# Patient Record
Sex: Male | Born: 1990 | Race: Black or African American | Hispanic: No | Marital: Single | Smoking: Former smoker
Health system: Southern US, Community
[De-identification: ages and names within clinical notes are randomized; demographics above are authoritative.]

---
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
## 2013-06-30 ENCOUNTER — Encounter (HOSPITAL_COMMUNITY): Payer: Self-pay | Admitting: Emergency Medicine

## 2013-06-30 ENCOUNTER — Emergency Department (HOSPITAL_COMMUNITY)
Admission: EM | Admit: 2013-06-30 | Discharge: 2013-06-30 | Disposition: A | Discharge status: Home / Self Care | Payer: Self-pay | Attending: Emergency Medicine | Admitting: Emergency Medicine

## 2013-06-30 DIAGNOSIS — F17200 Nicotine dependence, unspecified, uncomplicated: Secondary | ICD-10-CM | POA: Insufficient documentation

## 2013-06-30 DIAGNOSIS — N342 Other urethritis: Secondary | ICD-10-CM | POA: Insufficient documentation

## 2013-06-30 LAB — URINALYSIS, ROUTINE W REFLEX MICROSCOPIC
Bilirubin Urine: NEGATIVE
Glucose, UA: NEGATIVE mg/dL
Ketones, ur: NEGATIVE mg/dL
Nitrite: NEGATIVE
Protein, ur: NEGATIVE mg/dL
Specific Gravity, Urine: <1.005 — ABNORMAL LOW (ref 1.005–1.030)
Urobilinogen, UA: 0.2 mg/dL (ref 0.0–1.0)
pH: 6 (ref 5.0–8.0)

## 2013-06-30 LAB — URINE MICROSCOPIC-ADD ON

## 2013-06-30 MED ORDER — LIDOCAINE HCL (PF) 1 % IJ SOLN
INTRAMUSCULAR | Status: AC
  Administered 2013-06-30: 2.1 mL
  Filled 2013-06-30: qty 5

## 2013-06-30 MED ORDER — AZITHROMYCIN 250 MG PO TABS
1000.0000 mg | ORAL_TABLET | Freq: Once | ORAL | Status: AC
  Administered 2013-06-30: 1000 mg via ORAL
  Filled 2013-06-30: qty 4

## 2013-06-30 MED ORDER — CEFTRIAXONE SODIUM 250 MG IJ SOLR
250.0000 mg | Freq: Once | INTRAMUSCULAR | Status: AC
  Administered 2013-06-30: 250 mg via INTRAMUSCULAR
  Filled 2013-06-30: qty 250

## 2013-06-30 NOTE — ED Notes (Signed)
Pt c/o clear/yellow penile discharge and burning with urination.

## 2013-07-01 LAB — URINE CULTURE: Colony Count: NO GROWTH

## 2013-07-01 NOTE — ED Provider Notes (Signed)
CSN: [id number]     Arrival date & time 06/30/13  1017 History   First MD Initiated Contact with Patient 06/30/13 1102     Chief Complaint  Patient presents with  . SEXUALLY TRANSMITTED DISEASE   (Consider location/radiation/quality/duration/timing/severity/associated sxs/prior Treatment) Patient is a 22 y.o. male presenting with penile discharge. The history is provided by the patient.  Penile Discharge This is a new problem. The current episode started in the past 7 days. The problem occurs constantly. The problem has been unchanged. Associated symptoms include urinary symptoms. Pertinent negatives include no abdominal pain, chills, fever, headaches, joint swelling, myalgias, nausea, numbness, rash, sore throat, swollen glands, vomiting or weakness. Exacerbated by: urination. He has tried nothing for the symptoms. The treatment provided no relief.    History reviewed. No pertinent past medical history. History reviewed. No pertinent past surgical history. No family history on file. History  Substance Use Topics  . Smoking status: Current Every Day Smoker -- 1.00 packs/day  . Smokeless tobacco: Not on file  . Alcohol Use: Yes     Comment: occasional    Review of Systems  Constitutional: Negative for fever, chills, activity change and appetite change.  HENT: Negative for sore throat.   Gastrointestinal: Negative for nausea, vomiting and abdominal pain.  Genitourinary: Positive for dysuria and discharge. Negative for frequency, hematuria, flank pain, decreased urine volume, scrotal swelling, difficulty urinating, genital sores, penile pain and testicular pain.  Musculoskeletal: Negative for back pain, joint swelling and myalgias.  Skin: Negative for rash.  Neurological: Negative for weakness, numbness and headaches.  All other systems reviewed and are negative.    Allergies  Review of patient's allergies indicates no known allergies.  Home Medications  No current outpatient  prescriptions on file. BP 144/84  Pulse 85  Temp(Src) 97 F (36.1 C)  Resp 18  Ht 6\' 1"  (1.854 m)  Wt 186 lb (84.369 kg)  BMI 24.55 kg/m2  SpO2 100% Physical Exam  Nursing note and vitals reviewed. Cardiovascular: Normal rate, regular rhythm, normal heart sounds and intact distal pulses.   No murmur heard. Pulmonary/Chest: Effort normal and breath sounds normal. No respiratory distress.  Abdominal: Soft. He exhibits no distension. There is no tenderness. There is no rebound and no guarding.  Genitourinary: Testes normal and penis normal. Right testis shows no mass and no tenderness. Left testis shows no mass and no tenderness. Circumcised.  Yellow drainage from penis.  No lesions, erythema or edema.    Musculoskeletal: Normal range of motion.    ED Course  Procedures (including critical care time) Labs Review Labs Reviewed  URINALYSIS, ROUTINE W REFLEX MICROSCOPIC - Abnormal; Notable for the following:    Specific Gravity, Urine <1.005 (*)    Hgb urine dipstick TRACE (*)    Leukocytes, UA MODERATE (*)    All other components within normal limits  URINE MICROSCOPIC-ADD ON - Abnormal; Notable for the following:    Bacteria, UA MANY (*)    All other components within normal limits  URINE CULTURE  GC/CHLAMYDIA PROBE AMP   Imaging Review No results found.  EKG Interpretation   None       MDM   1. Urethritis, unspecified    Pt reports recent unprotected intercourse.  Has yellow penile drainage w/o abd pain, fever, vomiting or testicle pain.  He agrees to IM rocephin and po Zithromax.  Gc and Chlamydia cultures pending.     He is well appearing and stable for d/c.     Reginald Carter  Merlene Morse, PA-C 07/01/13 2119

## 2013-07-02 LAB — GC/CHLAMYDIA PROBE AMP
CT Probe RNA: POSITIVE — AB
GC Probe RNA: POSITIVE — AB

## 2013-07-02 NOTE — ED Provider Notes (Signed)
Medical screening examination/treatment/procedure(s) were performed by non-physician practitioner and as supervising physician I was immediately available for consultation/collaboration.  EKG Interpretation   None        Elira Colasanti, MD 07/02/13 1318 

## 2013-07-03 ENCOUNTER — Telehealth (HOSPITAL_COMMUNITY): Payer: Self-pay | Admitting: Emergency Medicine

## 2013-07-03 NOTE — ED Notes (Signed)
Patient informed of positive results after id'd x 2 and informed of need to notify partner to be treated. 

## 2013-07-03 NOTE — ED Notes (Signed)
Patient has +Chalmydia and +Gonorrhea.

## 2013-07-03 NOTE — ED Notes (Signed)
+  Chlamydia. +Gonorrhea. Patient treated with Rocephin and Zithromax. [employer] faxed. 

## 2024-01-13 ENCOUNTER — Encounter: Payer: Self-pay | Admitting: Nurse Practitioner

## 2024-01-13 ENCOUNTER — Ambulatory Visit: Payer: Self-pay | Admitting: Nurse Practitioner

## 2024-01-13 DIAGNOSIS — N341 Nonspecific urethritis: Secondary | ICD-10-CM

## 2024-01-13 DIAGNOSIS — Z113 Encounter for screening for infections with a predominantly sexual mode of transmission: Secondary | ICD-10-CM

## 2024-01-13 LAB — GRAM STAIN

## 2024-01-13 LAB — HM HEPATITIS C SCREENING LAB: HM Hepatitis Screen: NEGATIVE

## 2024-01-13 LAB — HM HIV SCREENING LAB: HM HIV Screening: NEGATIVE

## 2024-01-13 MED ORDER — DOXYCYCLINE HYCLATE 100 MG PO TABS: 100.0000 mg | ORAL_TABLET | Freq: Two times a day (BID) | ORAL | Status: AC
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
## 2024-01-13 NOTE — Progress Notes (Signed)
 Pt here for STI screening.  Gram stain results reviewed with patient.  Results positive for NGU.  Doxycycline 100mg  #14 dispensed to patient.  Counseled on medication, side effects, plan of care and when to contact clinic with questions or concerns.  Verbalizes understanding.  Contact card x 1.  [employer] pamphlet provided.  Report card completed.-Marcelino Campos, RN

## 2024-01-15 LAB — HBV ANTIGEN/ANTIBODY STATE LAB
Hep B Core Total Ab: NONREACTIVE
Hep B S Ab: REACTIVE
Hepatitis B Surface Antigen: NONREACTIVE

## 2024-01-18 LAB — CHLAMYDIA/GC NAA, CONFIRMATION
Chlamydia trachomatis, NAA: NEGATIVE
Neisseria gonorrhoeae, NAA: NEGATIVE

## 2024-01-18 LAB — GONOCOCCUS CULTURE

## 2024-01-23 ENCOUNTER — Encounter: Payer: Self-pay | Admitting: Nurse Practitioner

## 2024-02-21 NOTE — Addendum Note (Signed)
 Addended by: Curtis Uriarte on: 02/21/2024 02:09 PM   Modules accepted: Orders
